# Patient Record
Sex: Female | Born: 1950 | Race: White | Hispanic: No | Smoking: Former smoker
Health system: Southern US, Community
[De-identification: ages and names within clinical notes are randomized; demographics above are authoritative.]

## PROBLEM LIST (undated history)

## (undated) DIAGNOSIS — I10 Essential (primary) hypertension: Secondary | ICD-10-CM

---

## 1998-05-17 ENCOUNTER — Emergency Department (HOSPITAL_COMMUNITY): Admission: EM | Admit: 1998-05-17 | Discharge: 1998-05-17 | Payer: Self-pay | Admitting: Emergency Medicine

## 1998-09-09 ENCOUNTER — Other Ambulatory Visit: Admission: RE | Admit: 1998-09-09 | Discharge: 1998-09-09 | Payer: Self-pay | Admitting: *Deleted

## 2000-04-15 ENCOUNTER — Emergency Department (HOSPITAL_COMMUNITY): Admission: EM | Admit: 2000-04-15 | Discharge: 2000-04-15 | Payer: Self-pay | Admitting: Emergency Medicine

## 2000-08-15 ENCOUNTER — Encounter: Admission: RE | Admit: 2000-08-15 | Discharge: 2000-08-15 | Payer: Self-pay | Admitting: *Deleted

## 2000-08-15 ENCOUNTER — Encounter: Payer: Self-pay | Admitting: *Deleted

## 2000-09-01 ENCOUNTER — Ambulatory Visit (HOSPITAL_COMMUNITY): Admission: RE | Admit: 2000-09-01 | Discharge: 2000-09-01 | Payer: Self-pay | Admitting: *Deleted

## 2000-09-01 ENCOUNTER — Encounter (INDEPENDENT_AMBULATORY_CARE_PROVIDER_SITE_OTHER): Payer: Self-pay | Admitting: Specialist

## 2001-07-03 ENCOUNTER — Other Ambulatory Visit: Admission: RE | Admit: 2001-07-03 | Discharge: 2001-07-03 | Payer: Self-pay | Admitting: *Deleted

## 2002-09-02 ENCOUNTER — Other Ambulatory Visit: Admission: RE | Admit: 2002-09-02 | Discharge: 2002-09-02 | Payer: Self-pay | Admitting: *Deleted

## 2005-01-30 ENCOUNTER — Emergency Department (HOSPITAL_COMMUNITY): Admission: EM | Admit: 2005-01-30 | Discharge: 2005-02-05 | Payer: Self-pay | Admitting: Emergency Medicine

## 2005-06-15 ENCOUNTER — Other Ambulatory Visit: Admission: RE | Admit: 2005-06-15 | Discharge: 2005-06-15 | Payer: Self-pay | Admitting: Obstetrics & Gynecology

## 2005-06-29 ENCOUNTER — Ambulatory Visit (HOSPITAL_COMMUNITY): Admission: RE | Admit: 2005-06-29 | Discharge: 2005-06-29 | Payer: Self-pay | Admitting: *Deleted

## 2005-06-29 ENCOUNTER — Ambulatory Visit (HOSPITAL_BASED_OUTPATIENT_CLINIC_OR_DEPARTMENT_OTHER): Admission: RE | Admit: 2005-06-29 | Discharge: 2005-06-29 | Payer: Self-pay | Admitting: *Deleted

## 2005-07-06 ENCOUNTER — Ambulatory Visit: Payer: Self-pay | Admitting: Internal Medicine

## 2005-11-17 ENCOUNTER — Ambulatory Visit: Payer: Self-pay | Admitting: Pulmonary Disease

## 2005-11-24 ENCOUNTER — Ambulatory Visit: Payer: Self-pay | Admitting: Critical Care Medicine

## 2006-05-07 ENCOUNTER — Emergency Department (HOSPITAL_COMMUNITY): Admission: EM | Admit: 2006-05-07 | Discharge: 2006-05-07 | Payer: Self-pay | Admitting: Emergency Medicine

## 2007-10-19 ENCOUNTER — Ambulatory Visit: Payer: Self-pay | Admitting: Internal Medicine

## 2007-10-22 ENCOUNTER — Ambulatory Visit: Payer: Self-pay | Admitting: Internal Medicine

## 2007-10-26 ENCOUNTER — Telehealth (INDEPENDENT_AMBULATORY_CARE_PROVIDER_SITE_OTHER): Payer: Self-pay | Admitting: *Deleted

## 2007-11-19 ENCOUNTER — Telehealth: Payer: Self-pay | Admitting: Internal Medicine

## 2007-11-20 ENCOUNTER — Encounter: Payer: Self-pay | Admitting: Internal Medicine

## 2007-11-20 DIAGNOSIS — F17200 Nicotine dependence, unspecified, uncomplicated: Secondary | ICD-10-CM | POA: Insufficient documentation

## 2007-11-20 DIAGNOSIS — J449 Chronic obstructive pulmonary disease, unspecified: Secondary | ICD-10-CM

## 2007-11-20 DIAGNOSIS — J4489 Other specified chronic obstructive pulmonary disease: Secondary | ICD-10-CM | POA: Insufficient documentation

## 2009-02-09 ENCOUNTER — Telehealth (INDEPENDENT_AMBULATORY_CARE_PROVIDER_SITE_OTHER): Payer: Self-pay | Admitting: *Deleted

## 2009-03-16 ENCOUNTER — Telehealth: Payer: Self-pay | Admitting: Internal Medicine

## 2009-03-16 ENCOUNTER — Ambulatory Visit: Payer: Self-pay | Admitting: Internal Medicine

## 2009-04-14 ENCOUNTER — Telehealth (INDEPENDENT_AMBULATORY_CARE_PROVIDER_SITE_OTHER): Payer: Self-pay | Admitting: *Deleted

## 2009-11-22 IMAGING — CR DG CHEST 2V
2 series · 2 of 2 positions shown · non-contrast
Comparison: Chest 10/19/2007 and CT chest 05/07/2006.

CLINICAL DATA: COPD.  Tobacco use.

CHEST - 2 VIEW

[view not recorded (1 of 2)]
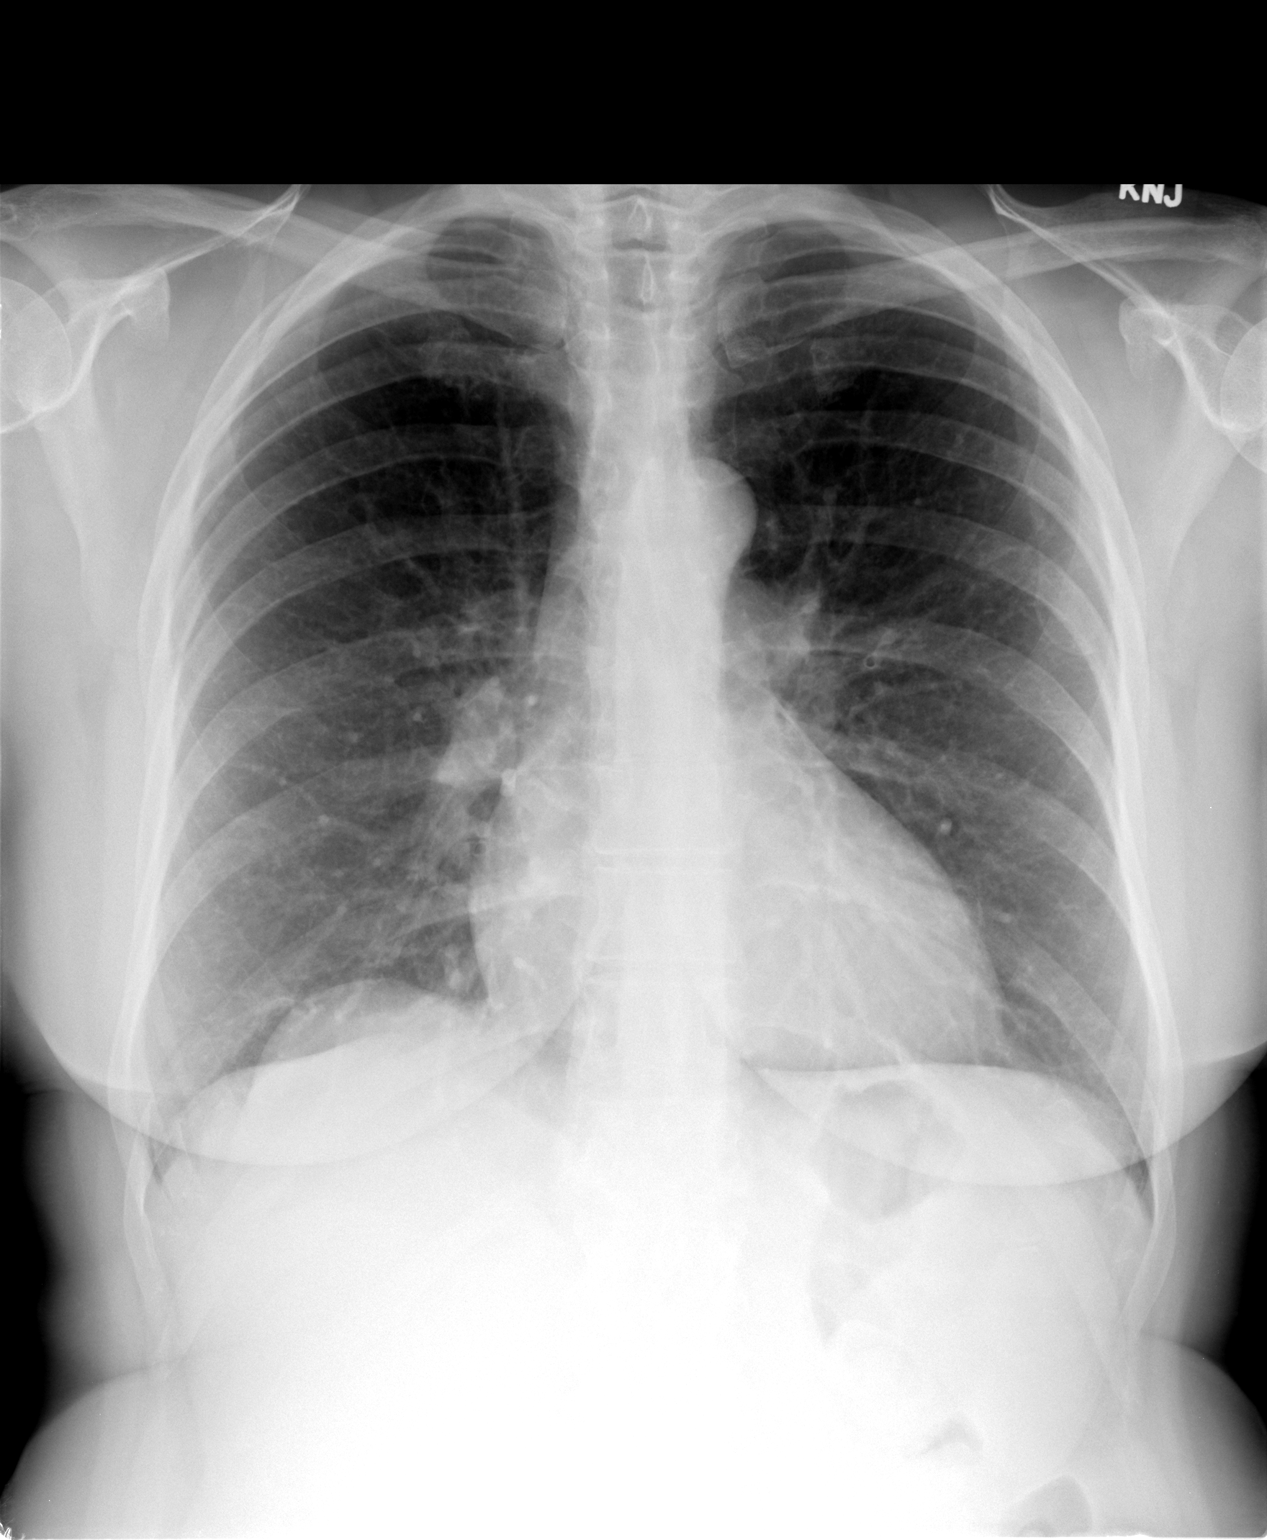

[view not recorded (2 of 2)]
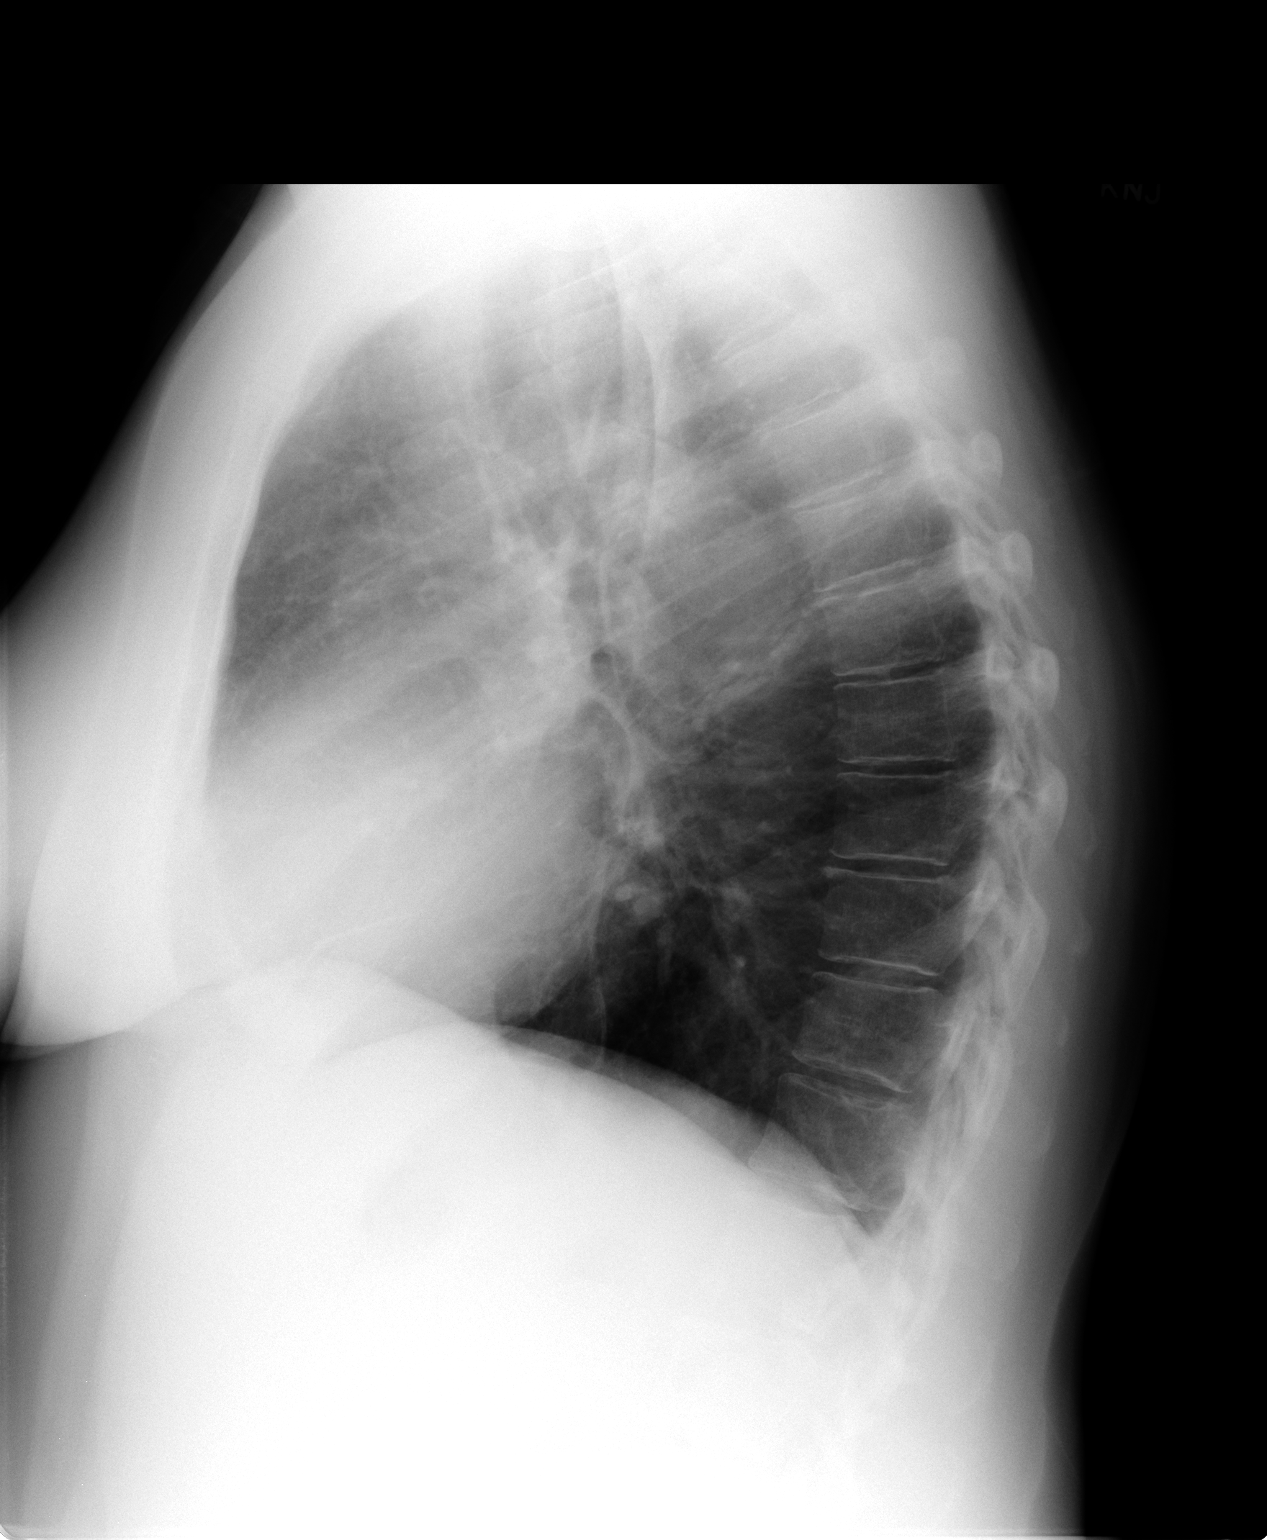

[2 of 2 positions shown; findings below may reference images not displayed]

FINDINGS: The lungs are clear.  Heart size is upper normal.  No
pleural effusion.  No focal bony abnormality.
IMPRESSION: No acute disease.  Stable compared to prior exam.

## 2010-03-22 ENCOUNTER — Telehealth (INDEPENDENT_AMBULATORY_CARE_PROVIDER_SITE_OTHER): Payer: Self-pay | Admitting: *Deleted

## 2010-04-22 ENCOUNTER — Telehealth: Payer: Self-pay | Admitting: Internal Medicine

## 2010-05-07 ENCOUNTER — Ambulatory Visit: Payer: Self-pay | Admitting: Internal Medicine

## 2010-07-12 ENCOUNTER — Telehealth (INDEPENDENT_AMBULATORY_CARE_PROVIDER_SITE_OTHER): Payer: Self-pay | Admitting: *Deleted

## 2010-12-21 NOTE — Assessment & Plan Note (Signed)
Summary: rov/apc   Primary Provider/Referring Provider:  [name]  CC:  Follow up visit-COPD.Marland Kitchen  History of Present Illness: 02/2609-  PROBLEMS: 1. Chronic obstructive pulmonary disease. 2. Tobacco abuse.   HISTORY:  She was here November 28, but returns now saying that a viral respiratory illness went through the family. She is feeling dizzy. [facility] and had what she says was an MRI, possibly a CT showing mild chronic sinusitis. She is now having some pain in her ear. No fever. Some nausea. A little loose stool. She continues to smoke.   02/2609- COPD, Tobacco abuse Still smoking. Meds still work ok. She still smokes some..Moderate COPD 10/05/00 FEV1/ FVC 0.59. Denies chest pain, palpitation, cough or wheeze. Denies chest pain, palpitation.  May 07, 2010- COPD, tobacco Has reduced cigs to 1/2 PPD "giving it some serious thought". Says she feels well- denies cough, chest pain, palpitation, SOB. Does note occasional heart burn. Denies any dysphagia or hoarseness.Marland Kitchen Has been out doing yardwork for hours at a time. CXR- 2010- stable, NAD      Preventive Screening-Counseling & Management  Alcohol-Tobacco     Smoking Status: current-smoked x 38 years     Packs/Day: 0.5     Tobacco Counseling: to quit use of tobacco products  Current Medications (verified): 1)  Advair Diskus 100-50 Mcg/dose  Misc (Fluticasone-Salmeterol) .... Inhale 1 Puff Two Times A Day 2)  Prozac 40 Mg  Caps (Fluoxetine Hcl) .... Once Daily 3)  Proventil Hfa 108 (90 Base) Mcg/act  Aers (Albuterol Sulfate) .... As Needed 4)  Pravachol 40 Mg Tabs (Pravastatin Sodium) .... Take 1 By Mouth Once Daily  Allergies (verified): 1)  ! Sulfa 2)  ! Prevacid 3)  ! * Nexium 4)  ! Ceclor 5)  ! Epinephrine  Past History:  Past Surgical History: Last updated: 03/30/09 Tonsillectomy uterine polyps tendon release/ repair Right wrist  Family History: Last updated:  Mar 30, 2009 Father- died Alzheimers, respiratory failure Mother- died pneumonia, CHF  Social History: Last updated: 05/07/2010 Patient is a current smoker.  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  The patient denies shortness of breath with activity, shortness of breath at rest, productive cough, non-productive cough, coughing up blood, chest pain, irregular heartbeats, indigestion, loss of appetite, weight change, abdominal pain, difficulty swallowing, tooth/dental problems, headaches, nasal congestion/difficulty breathing through nose, and sneezing.    Vital Signs:  Patient profile:   60 year old female Height:      63 inches Weight:      167 pounds BMI:     29.69 O2 Sat:      96 % on Room air Pulse rate:   96 / minute BP sitting:   120 / 70  (left arm) Cuff size:   regular  Vitals Entered By: Reynaldo Minium CMA (May 07, 2010 9:58 AM)  O2 Flow:  Room air CC: Follow up visit-COPD.   Physical Exam  Additional Exam:  General: A/Ox3; pleasant and cooperative, NAD, SKIN: no rash, lesions NODES: no lymphadenopathy HEENT: [place]/AT, EOM- WNL, Conjuctivae- clear, PERRLA, TM-WNL, Nose- clear, Throat- clear and wnl, Mallampati  II, no hoarseness or stridor NECK: Supple w/ fair ROM, JVD- none, normal carotid impulses w/o bruits Thyroid- not enlarged CHEST: Clear to P&A HEART: RRR, no m/g/r heard ABDOMEN:  ZOX:WRUE, nl pulses, no edema  NEURO: Grossly  intact to observation      Impression & Recommendations:  Problem # 1:  COPD (ICD-496) Clinically stable. We will refill meds. Will get f/u CXR  Problem # 2:  TOBACCO ABUSE (ICD-305.1)  I talked with her again about smoking cessation. She has had some intermittent, nonprogressive sore throats without hoarseness. I discussed warning sings for laryngeal cancer to give her a little extra push off cigarettes, and discussed availability of ENT for laryngeal exam. She will report any change or progression of symotoms as agreed.  Other Orders: Est. Patient Level III (45409) T-2 View CXR (71020TC)  Patient Instructions: 1)  Please schedule a follow-up appointment in 1 year. 2)  A chest x-ray has been recommended.  Your imaging study may require preauthorization.  3)  Meds refilled 4)  Please keep working at the smoking - and let me know if your throat gets worse so we can get you checked. Prescriptions: PROVENTIL HFA 108 (90 BASE) MCG/ACT  AERS (ALBUTEROL SULFATE) as needed  #1 x prn   Entered and Authorized by:   Waymon Budge MD   Signed by:   Waymon Budge MD on 05/07/2010   Method used:   Electronically to        [facility] (retail)       [address]       [place], [place]  [address]       Ph: [phone]       Fax: [phone]   RxID:   [id number] ADVAIR DISKUS 100-50 MCG/DOSE  MISC (FLUTICASONE-SALMETEROL) Inhale 1 puff two times a day  #1 x prn   Entered and Authorized by:   Waymon Budge MD   Signed by:   Waymon Budge MD on 05/07/2010   Method used:   Electronically to        [facility] (retail)       [address]       [place], [place]  [address]       Ph: [phone]       Fax: [phone]   RxID:   [id number]

## 2010-12-21 NOTE — Progress Notes (Signed)
Summary: talk to nurse---pred taper  Phone Note Call from Patient Call back at Home Phone [phone]   Caller: Patient Call For: young Reason for Call: Talk to Nurse Summary of Call: Pt c/o of wheezing, coughing, and sob x1 week, pls advise.//[facility] Initial call taken by: Darletta Moll,  July 12, 2010 11:08 AM  Follow-up for Phone Call        called and spoke with pt.  pt was recently seen by Cy in June.  Pt now calls today c/o wheezing x several weeks, non-productive cough, and increased sob with exertion and tightness in chest.  pt states she is using her Advair two times a day but has increased her proventil use to 3 to 4 times daily.  offered pt an appt tomorrow with CY.  Pt declined stating she "didn't feel like she needed to come in since she was just in in June."  Will forward message to CY to address.  Aundra Millet Reynolds LPN  July 12, 2010 11:13 AM   Allergies:  sulfa, prevacid, nexium, ceclor, epinephrine  Additional Follow-up for Phone Call Additional follow up Details #1::        Per CDY-give pt Prednisone 10mg  #20 take 4 by mouth x 2 days, 3 x 2 days, 2 x 2 days, 1 x 2 days, then stop.No refills.Reynaldo Minium CMA  July 12, 2010 12:02 PM     Additional Follow-up for Phone Call Additional follow up Details #2::    called and spoke with pt.  pt aware rx sent to pharmacy.  Aundra Millet Reynolds LPN  July 12, 2010 12:06 PM   New/Updated Medications: PREDNISONE 10 MG TABS (PREDNISONE) take 4 tabs daily x 2 days, then 3 tabs daily x 2 days, then 2 tabs daily x 2 days, then 1 tab daily x 2 days, then stop Prescriptions: PREDNISONE 10 MG TABS (PREDNISONE) take 4 tabs daily x 2 days, then 3 tabs daily x 2 days, then 2 tabs daily x 2 days, then 1 tab daily x 2 days, then stop  #20 x 0   Entered by:   Arman Filter LPN   Authorized by:   Waymon Budge MD   Signed by:   Arman Filter LPN on 09/81/1914   Method used:   Electronically to        [facility] (retail)       [address]       [place], [place]  [address]       Ph: [phone]       Fax: [phone]   RxID:   [id number]

## 2010-12-21 NOTE — Progress Notes (Signed)
Summary: rx-lmtcb- pt has made a f/u appt  Phone Note Call from Patient Call back at [phone]   Caller: Patient Call For: young Summary of Call: pt also needs a refill on her proventil inhaler. [facility] Initial call taken by: Eugene Gavia,  Mar 22, 2010 9:22 AM  Follow-up for Phone Call        pt last seen in 02/2009, with no appts scheduled. LMTCB to schedule appt in order to give refills. Carron Curie CMA  Mar 22, 2010 9:48 AM  PT HAS MADE A /FU APPT W/ CY FOR 04/21/10. PER CRYSTAL I INFORMED PT TO KEEP THIS APPT AND THAT HER RX WOULD BE REFILLED IN THE MEANTIME. Tivis Ringer, CNA  Mar 22, 2010 9:58 AM  Additional Follow-up for Phone Call Additional follow up Details #1::        Spoke with pt.  Pt aware proventil rx sent to [facility] but must keep scheduled f/u to receive additional rxs.  She verbalized understanding and also requesting rx for advair.  Advair sent as well.  Gweneth Dimitri RN  Mar 22, 2010 10:09 AM     Prescriptions: PROVENTIL HFA 108 (90 BASE) MCG/ACT  AERS (ALBUTEROL SULFATE) as needed  #1 x 1   Entered by:   Gweneth Dimitri RN   Authorized by:   Waymon Budge MD   Signed by:   Gweneth Dimitri RN on 03/22/2010   Method used:   Electronically to        [facility] (retail)       [address]       [place], [place]  [address]       Ph: [phone]       Fax: [phone]   RxID:   [id number]

## 2010-12-21 NOTE — Progress Notes (Signed)
Summary: nos appt  Phone Note Call from Patient   Caller: [email or web address]  Call For: young Summary of Call: ATC pt to [facility]sc nos from 6/1 home phone disconnected and she no longer works at number listed. Initial call taken by: Darletta Moll,  April 22, 2010 3:55 PM

## 2011-04-05 NOTE — Assessment & Plan Note (Signed)
[facility]                             PULMONARY OFFICE NOTE   BENIGNA, DELISI                    MRN:          [id number]  DATE:10/19/2007                            DOB:          Apr 13, 1951    PROBLEM:  1. COPD.  2. Tobacco abuse.   HISTORY:  Last seen almost two years ago.  She had quit smoking for  seven weeks with Chantix and wants to try again.  Had a bad cold about  three weeks ago with persistent cough, no blood, no chest pain.  Transient fever, sore throat, and systemic malaise have resolved.   MEDICATION:  1. Advair 100/50.  2. Prozac 40 mg.  3. Proventil HFA inhaler.   DRUG INTOLERANCE:  1. SULFA.  2. CECLOR.  3. EPINEPHRINE.  4. PREVACID.  5. NEXIUM.   No primary physician.   OBJECTIVE:  VITAL SIGNS:  Weight 171 pounds, BP 122/80, pulse 101, room  air saturation 96%.  GENERAL:  She is not in acute distress today and has minimal cough  noted.  I do not find adenopathy or rash.  HEENT:  There is no postnasal drainage or significant nasal congestion,  pharynx is clear, voice quality normal.  CHEST:  Quiet without wheeze, rales, rhonchi, or dullness.  CARDIAC:  Heart sounds are regular without murmur or gallop.   IMPRESSION:  1. Chronic obstructive pulmonary disease with acute exacerbation.  2. Ongoing tobacco abuse.   PLAN:  1. Smoking cessation was reemphasized.  We discussed Chantix again and      prescribed a starter set to be followed by maintenance set.  2. Chest x-ray.  3. Flu vaccine discussed and given.  4. Schedule a return in one year, earlier p.r.n.  2.     Clinton D. Maple Hudson, MD, Tonny Bollman, FACP  Electronically Signed    CDY/MedQ  DD: 10/20/2007  DT: 10/20/2007  Job #: [id number]

## 2011-04-05 NOTE — Assessment & Plan Note (Signed)
[facility]                             PULMONARY OFFICE NOTE   Ebony Thomas, Ebony Thomas                    MRN:          [id number]  DATE:10/22/2007                            DOB:          1950/12/25    PROBLEMS:  1. Chronic obstructive pulmonary disease.  2. Tobacco abuse.   HISTORY:  She was here November 28, but returns now saying that a viral  respiratory illness went through the family. She is feeling dizzy. She  [facility] and had what she says was an MRI,  possibly a CT showing mild chronic sinusitis. She is now having some  pain in her ear. No fever. Some nausea. A little loose stool. She  continues to smoke.   MEDICATIONS:  1. Advair 100/50.  2. Prozac 40 mg.  3. Proventil HFA.   Drug intolerant SULFA, CECLOR, EPINEPHRINE, PREVACID, and NEXIUM.   OBJECTIVE:  Weight 172 pounds, blood pressure 130/84, pulse 96, room air  saturation 98%. Left tympanic membrane is dull and there is some fluid  behind the drum. Right side looks okay. She has an upper denture.  Pharynx is clear with no post-nasal drip. No cervical adenopathy. No  periorbital edema. Chest sounds clear. Heart sounds normal.   IMPRESSION:  Rhinitis/rhinosinusitis, otitis media left.   PLAN:  Nebulizer nasal Neo-Synephrine, Augment 875 mg b.i.d. for 7 days,  Sudafed, nasal saline lavage. Return one year or as scheduled.     Clinton D. Maple Hudson, MD, Tonny Bollman, FACP  Electronically Signed    CDY/MedQ  DD: 10/28/2007  DT: 10/29/2007  Job #: [id number]

## 2011-04-08 NOTE — Procedures (Signed)
[place]  Patient:    Ebony Thomas, Ebony Thomas                    MRN: [id number] Proc. Date: 09/01/00 Adm. Date:  [id number] Attending:  Mingo Amber CC:         Stacie Acres. Cliffton Asters, M.D.  Clinton D. Maple Hudson, M.D.   Procedure Report  PROCEDURE:  Video upper endoscopy.  INDICATIONS:  A 60 year old, female smoker complaining of chest discomfort and dysphagia.  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  Recently CBC and liver function tests were entirely normal.  A barium swallow revealed some spasm and tertiary contractions without any hiatal hernia, reflux, or stricture.  However, the patient kept complaining of difficulty swallowing and discomfort.  She was treated maximally for reflux with Nexium twice daily and Reglan at night.  She says that she developed hives and her symptoms were not significantly better, so she discontinued both medications.  In preparation for today, she is NPO since midnight.  PREPROCEDURE SEDATION:  She received 50 mg of Demerol and 6 mg of Versed intravenously.  In addition, her throat was anesthetized with Hurricaine spray and she was on 2 L of nasal cannula O2.  DESCRIPTION OF PROCEDURE:  The Olympus video upper endoscope was inserted via the mouth and advanced easily through the upper esophageal sphincter. Intubation was carried out to the descending duodenum.  On withdrawal, the mucosa was carefully evaluated.  The descending duodenum, bulb, and stomach appeared entirely normal.  There was minimal irregularity of the Z-line at 40 cm, which was biopsied.  However, there was no hiatal hernia or sign of esophagitis.  There were a few diminutive nodules of the esophagus of doubtful significance, which were also biopsied.  There was no sign of any stricturing. The patient tolerated the procedure well.  Pulse, blood pressure, and oximetry testing were stable throughout.  She will be  observed in recovery for one hour and discharged if alert with a benign abdomen.  ASSESSMENT:  A 60 year old female who I truly suspect has functional dysphagia and chest discomfort likely related to a generalized anxiety disorder.  She currently is on amitriptyline for depression.  However, this might not be the ideal agent and consideration could be given to switching her to Paxil or some agent more effective in anxiety.  I will ask her to discuss this with her primary physician.  The biopsies will be reviewed and if they show anything of significance, I will call her back to the office.  IMPRESSION:  Probable functional dysphagia and chest pain.  PLAN:  Review biopsies.  Consider treatment with an SSRI. DD:  09/01/00 TD:  09/02/00 Job: [id number]

## 2011-04-08 NOTE — Op Note (Signed)
Ebony Thomas, Ebony Thomas             ACCOUNT NO.:  [id number]   MEDICAL RECORD NO.:  [id number]          PATIENT TYPE:  AMB   LOCATION:  DSC                          FACILITY:  [facility]   PHYSICIAN:  Tennis Must Meyerdierks, M.D.DATE OF BIRTH:  Mar 02, 1951   DATE OF PROCEDURE:  DATE OF DISCHARGE:                                 OPERATIVE REPORT   PREOP DIAGNOSIS:  [name] stenosing tenosynovitis, right wrist.   POSTOP DIAGNOSIS:  [name] stenosing tenosynovitis, right wrist.   PROCEDURE:  Release of first dorsal compartment, right wrist.   SURGEON:  Lowell Bouton, M.D.   ANESTHESIA:  Half percent Marcaine local with sedation.   OPERATIVE FINDINGS:  The patient had two separate compartments with  significant tightness in both of the sheaths.   PROCEDURE:  Under 1/2% Marcaine local anesthesia with a tourniquet on the  right arm, the right hand was prepped and draped in usual fashion and after  exsanguinating the limb, the tourniquet was inflated to 250 mmHg. A  transverse incision was made over the radial styloid and carried down  through the subcutaneous tissues. Blunt dissection was carried down to the  tendon sheath of the APL tendon. The sheath was then incised with a knife  and then completely released with the scissors. A separate sheath was found  for the extensor pollicis brevis tendon and likewise it was released with  the scissors. The wound was then irrigated copiously. The skin was closed  with a 3-0 subcuticular Prolene. Steri-Strips were applied followed by  sterile dressings and a thumb spica splint. The patient tolerated the  procedure well and went to the recovery room awake and stable condition.      Lowell Bouton, M.D.  Electronically Signed     EMM/MEDQ  D:  06/29/2005  T:  06/29/2005  Job:  [id number]

## 2021-11-17 ENCOUNTER — Ambulatory Visit
Admission: EM | Admit: 2021-11-17 | Discharge: 2021-11-17 | Disposition: A | Discharge status: Home / Self Care | Payer: Medicare PPO

## 2021-11-17 ENCOUNTER — Other Ambulatory Visit: Payer: Self-pay

## 2021-11-17 DIAGNOSIS — N3000 Acute cystitis without hematuria: Secondary | ICD-10-CM

## 2021-11-17 DIAGNOSIS — N390 Urinary tract infection, site not specified: Secondary | ICD-10-CM

## 2021-11-17 HISTORY — DX: Essential (primary) hypertension: I10

## 2021-11-17 LAB — POCT URINALYSIS DIP (MANUAL ENTRY)
Bilirubin, UA: NEGATIVE
Blood, UA: NEGATIVE
Glucose, UA: 100 mg/dL — AB
Ketones, POC UA: NEGATIVE mg/dL
Leukocytes, UA: NEGATIVE
Nitrite, UA: POSITIVE — AB
Protein Ur, POC: NEGATIVE mg/dL
Spec Grav, UA: 1.01 (ref 1.010–1.025)
Urobilinogen, UA: 1 E.U./dL
pH, UA: 7 (ref 5.0–8.0)

## 2021-11-17 MED ORDER — CIPROFLOXACIN HCL 500 MG PO TABS: 500.0000 mg | ORAL_TABLET | Freq: Two times a day (BID) | ORAL | 0 refills | Status: AC

## 2021-11-17 NOTE — ED Triage Notes (Signed)
Pot reports burning and pain when urinating x 2 days. Pt has an appt with Urology in January, 2023.

## 2021-11-17 NOTE — ED Provider Notes (Signed)
[facility]   MRN: [id number] DOB: 1951/02/11  Subjective:   Ebony Thomas is a 70 y.o. female presenting for 2 day history of urinary frequency, dysuria, urinary urgency, left flank pain. Has frequent UTIs. Hydrates well. Limits urinary irritants. Has an appointment with urology in January 2023. Has a bilateral renal stones.   No current facility-administered medications for this encounter.  Current Outpatient Medications:    albuterol (VENTOLIN HFA) 108 (90 Base) MCG/ACT inhaler, Inhale into the lungs., Disp: , Rfl:    levothyroxine (SYNTHROID) 112 MCG tablet, Take by mouth., Disp: , Rfl:    amLODipine (NORVASC) 5 MG tablet, Take 5 mg by mouth daily., Disp: , Rfl:    aspirin 81 MG EC tablet, Take by mouth., Disp: , Rfl:    FLUoxetine (PROZAC) 10 MG capsule, Take 10 mg by mouth daily., Disp: , Rfl:    FLUoxetine (PROZAC) 40 MG capsule, Take 40 mg by mouth every morning., Disp: , Rfl:    pravastatin (PRAVACHOL) 40 MG tablet, Take 40 mg by mouth every morning., Disp: , Rfl:    Allergies  Allergen Reactions   Cefaclor Hives and Swelling   Epinephrine Other (See Comments)    tachycardia Shakes, Jittery Shakes, Jittery, heart racing    Esomeprazole Magnesium    Lansoprazole    Sulfonamide Derivatives    Prednisone Rash   Sulfa Antibiotics Other (See Comments) and Rash    Edema Edema     Past Medical History:  Diagnosis Date   Hypertension      History reviewed. No pertinent surgical history.  History reviewed. No pertinent family history.  Social History   Tobacco Use   Smoking status: Former    Types: Cigarettes    Quit date: 11/14/2020    Years since quitting: 1.0   Smokeless tobacco: Never  Substance Use Topics   Alcohol use: Not Currently   Drug use: Never    ROS   Objective:   Vitals: BP (!) 178/93 (BP Location: Right Arm)    Pulse 94    Temp 98.9 F (37.2 C) (Oral)    Resp 18    SpO2 92%   Physical Exam Constitutional:       General: She is not in acute distress.    Appearance: Normal appearance. She is well-developed. She is not ill-appearing.  HENT:     Head: Normocephalic and atraumatic.     Nose: Nose normal.     Mouth/Throat:     Mouth: Mucous membranes are moist.     Pharynx: Oropharynx is clear.  Eyes:     General: No scleral icterus.    Extraocular Movements: Extraocular movements intact.     Pupils: Pupils are equal, round, and reactive to light.  Cardiovascular:     Rate and Rhythm: Normal rate.  Pulmonary:     Effort: Pulmonary effort is normal.  Skin:    General: Skin is warm and dry.  Neurological:     General: No focal deficit present.     Mental Status: She is alert and oriented to person, place, and time.  Psychiatric:        Mood and Affect: Mood normal.        Behavior: Behavior normal.   Results for orders placed or performed during the hospital encounter of 11/17/21 (from the past 24 hour(s))  POCT urinalysis dipstick     Status: Abnormal   Collection Time: 11/17/21  6:12 PM  Result Value Ref Range   Color, UA  yellow yellow   Clarity, UA cloudy (A) clear   Glucose, UA =100 (A) negative mg/dL   Bilirubin, UA negative negative   Ketones, POC UA negative negative mg/dL   Spec Grav, UA 1.010 1.010 - 1.025   Blood, UA negative negative   pH, UA 7.0 5.0 - 8.0   Protein Ur, POC negative negative mg/dL   Urobilinogen, UA 1.0 0.2 or 1.0 E.U./dL   Nitrite, UA Positive (A) Negative   Leukocytes, UA Negative Negative    Assessment and Plan :   PDMP not reviewed this encounter.  1. Acute cystitis without hematuria   2. Recurrent UTI    Start Cipro to cover for acute cystitis, urine culture pending.  Recommended aggressive hydration, limiting urinary irritants. Counseled patient on potential for adverse effects with medications prescribed/recommended today, ER and return-to-clinic precautions discussed, patient verbalized understanding.    Jaynee Eagles, PA-C 11/17/21 1820
